# Patient Record
Sex: Female | Born: 1980 | Race: White | Hispanic: No | Marital: Single | Smoking: Current every day smoker
Health system: Southern US, Community
[De-identification: ages and names within clinical notes are randomized; demographics above are authoritative.]

---

## 2004-11-13 ENCOUNTER — Emergency Department: Payer: Self-pay | Admitting: Emergency Medicine

## 2004-11-18 ENCOUNTER — Emergency Department: Payer: Self-pay | Admitting: General Practice

## 2005-02-17 ENCOUNTER — Emergency Department: Payer: Self-pay | Admitting: Unknown Physician Specialty

## 2005-06-30 ENCOUNTER — Emergency Department: Payer: Self-pay | Admitting: Emergency Medicine

## 2005-07-10 ENCOUNTER — Emergency Department: Payer: Self-pay | Admitting: Emergency Medicine

## 2005-07-15 ENCOUNTER — Emergency Department: Payer: Self-pay | Admitting: Emergency Medicine

## 2005-07-16 ENCOUNTER — Ambulatory Visit: Payer: Self-pay | Admitting: Emergency Medicine

## 2005-09-10 ENCOUNTER — Observation Stay: Payer: Self-pay

## 2005-09-25 ENCOUNTER — Emergency Department: Payer: Self-pay | Admitting: Emergency Medicine

## 2006-01-06 ENCOUNTER — Observation Stay: Payer: Self-pay | Admitting: Obstetrics and Gynecology

## 2006-06-09 ENCOUNTER — Emergency Department: Payer: Self-pay | Admitting: Emergency Medicine

## 2006-06-29 ENCOUNTER — Ambulatory Visit: Payer: Self-pay | Admitting: Unknown Physician Specialty

## 2007-01-12 ENCOUNTER — Ambulatory Visit: Payer: Self-pay

## 2007-01-17 ENCOUNTER — Ambulatory Visit: Payer: Self-pay | Admitting: Internal Medicine

## 2007-01-18 ENCOUNTER — Emergency Department: Payer: Self-pay | Admitting: Emergency Medicine

## 2007-01-25 ENCOUNTER — Emergency Department: Payer: Self-pay | Admitting: Emergency Medicine

## 2007-03-08 ENCOUNTER — Observation Stay: Payer: Self-pay | Admitting: Internal Medicine

## 2007-03-08 ENCOUNTER — Other Ambulatory Visit: Payer: Self-pay

## 2007-05-03 ENCOUNTER — Emergency Department: Payer: Self-pay

## 2013-08-04 ENCOUNTER — Emergency Department (HOSPITAL_COMMUNITY)
Admission: EM | Admit: 2013-08-04 | Discharge: 2013-08-04 | Disposition: A | Discharge status: Home / Self Care | Payer: Self-pay | Attending: Emergency Medicine | Admitting: Emergency Medicine

## 2013-08-04 ENCOUNTER — Encounter (HOSPITAL_COMMUNITY): Payer: Self-pay | Admitting: Emergency Medicine

## 2013-08-04 DIAGNOSIS — H9209 Otalgia, unspecified ear: Secondary | ICD-10-CM | POA: Insufficient documentation

## 2013-08-04 DIAGNOSIS — K029 Dental caries, unspecified: Secondary | ICD-10-CM | POA: Insufficient documentation

## 2013-08-04 DIAGNOSIS — K047 Periapical abscess without sinus: Secondary | ICD-10-CM

## 2013-08-04 DIAGNOSIS — K044 Acute apical periodontitis of pulpal origin: Secondary | ICD-10-CM | POA: Insufficient documentation

## 2013-08-04 DIAGNOSIS — F17200 Nicotine dependence, unspecified, uncomplicated: Secondary | ICD-10-CM | POA: Insufficient documentation

## 2013-08-04 DIAGNOSIS — Z9889 Other specified postprocedural states: Secondary | ICD-10-CM | POA: Insufficient documentation

## 2013-08-04 DIAGNOSIS — R51 Headache: Secondary | ICD-10-CM | POA: Insufficient documentation

## 2013-08-04 MED ORDER — HYDROCODONE-ACETAMINOPHEN 5-325 MG PO TABS: 1.0000 | ORAL_TABLET | ORAL | Status: DC | PRN

## 2013-08-04 MED ORDER — PENICILLIN V POTASSIUM 500 MG PO TABS: 500.0000 mg | ORAL_TABLET | Freq: Three times a day (TID) | ORAL | Status: DC

## 2013-08-04 MED ORDER — PENICILLIN V POTASSIUM 250 MG PO TABS
500.0000 mg | ORAL_TABLET | Freq: Once | ORAL | Status: AC
  Administered 2013-08-04: 500 mg via ORAL
  Filled 2013-08-04: qty 2

## 2013-08-04 MED ORDER — HYDROCODONE-ACETAMINOPHEN 5-325 MG PO TABS
1.0000 | ORAL_TABLET | Freq: Once | ORAL | Status: AC
  Administered 2013-08-04: 1 via ORAL
  Filled 2013-08-04: qty 1

## 2013-08-04 NOTE — ED Provider Notes (Signed)
CSN: [id number]     Arrival date & time 08/04/13  1536 History  This chart was scribed for non-physician practitioner Dierdre Forth, PA, working with Dagmar Hait, MD by Ronal Fear, ED scribe. This patient was seen in room TR05C/TR05C and the patient's care was started at 4:40 PM.     Chief Complaint  Patient presents with  . Dental Pain   (Consider location/radiation/quality/duration/timing/severity/associated sxs/prior Treatment) The history is provided by the patient and medical records. No language interpreter was used.   HPI Comments: Betty Curry is a 33 y.o. female who presents to the Emergency Department complaining of sudden onset left sided lower dental pain onset 1x week ago. Pt states that after a lower incisor chipped she noticed a lump present in her left lower jaw 1 week ago.  She had surgery 1 months ago in [place] to remove 2 molars and states that they left remnants of teeth in her mouth. Pt states that the pain is a constant, dull ache at 9/10.  She has taken tramadol and ibuprofen with no relief.  She does not see a dentist regularly, she smokes 1/2 pack a day. She endorses associated subjective chills and unmeasured fever, but denies nausea, vomiting and facial swelling.       History reviewed. No pertinent past medical history. History reviewed. No pertinent past surgical history. History reviewed. No pertinent family history. History  Substance Use Topics  . Smoking status: Current Every Day Smoker    Types: Cigarettes  . Smokeless tobacco: Not on file  . Alcohol Use: No   OB History   Grav Para Term Preterm Abortions TAB SAB Ect Mult Living                 Review of Systems  Constitutional: Negative for fever, chills and appetite change.  HENT: Positive for dental problem and ear pain. Negative for drooling, facial swelling, nosebleeds, postnasal drip, rhinorrhea and trouble swallowing.   Eyes: Negative for pain and redness.  Respiratory:  Negative for cough and wheezing.   Cardiovascular: Negative for chest pain.  Gastrointestinal: Negative for nausea, vomiting and abdominal pain.  Musculoskeletal: Negative for neck pain and neck stiffness.  Skin: Negative for color change and rash.  Neurological: Positive for headaches (generalized). Negative for weakness and light-headedness.  All other systems reviewed and are negative.    Allergies  Review of patient's allergies indicates no known allergies.  Home Medications   Current Outpatient Rx  Name  Route  Sig  Dispense  Refill  . ibuprofen (ADVIL,MOTRIN) 600 MG tablet   Oral   Take 600 mg by mouth every 6 (six) hours as needed for moderate pain.         . traMADol (ULTRAM) 50 MG tablet   Oral   Take 50 mg by mouth every 6 (six) hours as needed for severe pain.         Marland Kitchen HYDROcodone-acetaminophen (NORCO/VICODIN) 5-325 MG per tablet   Oral   Take 1 tablet by mouth every 4 (four) hours as needed.   6 tablet   0   . penicillin v potassium (VEETID) 500 MG tablet   Oral   Take 1 tablet (500 mg total) by mouth 3 (three) times daily.   30 tablet   0    BP 105/66  Pulse 87  Temp(Src) 97.7 F (36.5 C)  Resp 18  SpO2 100% Physical Exam  Nursing note and vitals reviewed. Constitutional: She is oriented to person, place, and  time. She appears well-developed and well-nourished.  HENT:  Head: Normocephalic and atraumatic.  Right Ear: Tympanic membrane, external ear and ear canal normal.  Left Ear: Tympanic membrane, external ear and ear canal normal.  Nose: Nose normal. Right sinus exhibits no maxillary sinus tenderness and no frontal sinus tenderness. Left sinus exhibits no maxillary sinus tenderness and no frontal sinus tenderness.  Mouth/Throat: Uvula is midline, oropharynx is clear and moist and mucous membranes are normal. No oral lesions. Abnormal dentition. Dental caries present. No uvula swelling or lacerations. No oropharyngeal exudate, posterior  oropharyngeal edema, posterior oropharyngeal erythema or tonsillar abscesses.    No facial swelling noted No gross abscess No induration to the floor of the mouth  Eyes: Conjunctivae are normal. Pupils are equal, round, and reactive to light. Right eye exhibits no discharge. Left eye exhibits no discharge.  Neck: Normal range of motion. Neck supple.  Cardiovascular: Normal rate, regular rhythm, normal heart sounds and intact distal pulses.   No murmur heard. Pulmonary/Chest: Effort normal and breath sounds normal. No respiratory distress. She has no wheezes.  Abdominal: Soft. Bowel sounds are normal. She exhibits no distension. There is no tenderness.  Lymphadenopathy:    She has no cervical adenopathy.  Neurological: She is alert and oriented to person, place, and time.  Skin: Skin is warm and dry. No erythema.  Psychiatric: She has a normal mood and affect.    ED Course  Procedures (including critical care time)  DIAGNOSTIC STUDIES: Oxygen Saturation is 100% on RA, normal by my interpretation.    COORDINATION OF CARE: 4:48 PM- Pt advised of plan for treatment including antibiotics, a dental referral, and medication for pain and pt agrees.    Labs Review Labs Reviewed - No data to display Imaging Review No results found.  EKG Interpretation   None       MDM   1. Pain due to dental caries   2. Dental infection      Betty Curry presents with dental pain and caries.  Patient with toothache.  No gross abscess.  Exam unconcerning for Ludwig's angina or spread of infection.  Will treat with penicillin and pain medicine.  Urged patient to follow-up with dentist.  Pt given resources for dentistry.     It has been determined that no acute conditions requiring further emergency intervention are present at this time. The patient/guardian have been advised of the diagnosis and plan. We have discussed signs and symptoms that warrant return to the ED, such as changes or  worsening in symptoms.   Vital signs are stable at discharge.   BP 105/66  Pulse 87  Temp(Src) 97.7 F (36.5 C)  Resp 18  SpO2 100%  Patient/guardian has voiced understanding and agreed to follow-up with the PCP or specialist.    I personally performed the services described in this documentation, which was scribed in my presence. The recorded information has been reviewed and is accurate.   Dierdre ForthHannah Jeniel Slauson, PA-C 08/04/13 1700  Jashay Roddy, PA-C 08/04/13 1701

## 2013-08-04 NOTE — Discharge Instructions (Signed)
1. Medications: vicodin, penicillin, usual home medications °2. Treatment: rest, drink plenty of fluids, take medications as prescribed °3. Follow Up: Please followup with your primary doctor for discussion of your diagnoses and further evaluation after today's visit; if you do not have a primary care doctor use the resource guide provided to find one; f/u with dentistry as discussed ° ° °You have a dental injury. Use the resource guide listed below to help you find a dentist if you do not already have one to followup with. It is very important that you get evaluated by a dentist as soon as possible. Call tomorrow to schedule an appointment. Use your pain medication as prescribed and do not operate heavy machinery while on pain medication. Note that your pain medication contains acetaminophen (Tylenol) & its is not reccommended that you use additional acetaminophen (Tylenol) while taking this medication. Take your full course of antibiotics. Read the instructions below. ° °Eat a soft or liquid diet and rinse your mouth out after meals with warm water. You should see a dentist or return here at once if you have increased swelling, increased pain or uncontrolled bleeding from the site of your injury. ° ° °SEEK MEDICAL CARE IF:  °· You have increased pain not controlled with medicines.  °· You have swelling around your tooth, in your face or neck.  °· You have bleeding which starts, continues, or gets worse.  °· You have a fever >101 °· If you are unable to open your mouth ° ° °Emergency Department Resource Guide °1) Find a Doctor and Pay Out of Pocket °Although you won't have to find out who is covered by your insurance plan, it is a good idea to ask around and get recommendations. You will then need to call the office and see if the doctor you have chosen will accept you as a new patient and what types of options they offer for patients who are self-pay. Some doctors offer discounts or will set up payment plans for  their patients who do not have insurance, but you will need to ask so you aren't surprised when you get to your appointment. ° °2) Contact Your Local Health Department °Not all health departments have doctors that can see patients for sick visits, but many do, so it is worth a call to see if yours does. If you don't know where your local health department is, you can check in your phone book. The CDC also has a tool to help you locate your state's health department, and many state websites also have listings of all of their local health departments. ° °3) Find a Walk-in Clinic °If your illness is not likely to be very severe or complicated, you may want to try a walk in clinic. These are popping up all over the country in pharmacies, drugstores, and shopping centers. They're usually staffed by nurse practitioners or physician assistants that have been trained to treat common illnesses and complaints. They're usually fairly quick and inexpensive. However, if you have serious medical issues or chronic medical problems, these are probably not your best option. ° °No Primary Care Doctor: °- Call [facility] at  832-8000 - they can help you locate a primary care doctor that  accepts your insurance, provides certain services, etc. °- Physician Referral Service- 1-800-533-3463 ° °Chronic Pain Problems: °Organization         Address  Phone   Notes  °[facility]  (336) 297-2271 Patients need to be referred   by their primary care doctor.  ° °Medication Assistance: °Organization         Address  Phone   Notes  °[place] [facility] [address] °[place], [place] [address] (336) 641-8030 --Must be a resident of [place] °-- Must have NO insurance coverage whatsoever (no Medicaid/ Medicare, etc.) °-- The pt. MUST have a primary care doctor that directs their care regularly and follows them in the community °  °[facility]  (866) 331-1348   °[employer]  (888)  892-1162   ° °Agencies that provide inexpensive medical care: °Organization         Address  Phone   Notes  °[facility]  (336) 832-8035   °[facility]    (336) 832-7272   °[facility] [address] °[place], [place] [address] (336) 832-4777   °[facility] [address], °[place] (336) 271-4999   °[facility]    (336) 373-0678   °[facility]    (336) 272-1050   °[facility] ° [address], [place] Phone:  (336) 832-4444, Fax:  (336) 832-4440 Hours of Operation:  9 am - 6 pm, M-F.  Also accepts Medicaid/Medicare and self-pay.  °[facility] ° [address], [place] Phone: (336) 832-3150, Fax: (336) 832-3151. Hours of Operation:  8:30 am - 5:30 pm, M-F.  Also accepts Medicaid and self-pay.  °[facility] [place] [address], [place] Phone: (336) 878-6027   °[facility] [address], [place], [place] (336)723-1848, Ext. 123 Mondays & Thursdays: 7-9 AM.  First 15 patients are seen on a first come, first serve basis. °  ° °Medicaid-accepting [place] Providers: ° °Organization         Address  Phone   Notes  °[facility] [address], [place] (336) 641-2100 Also accepts self-pay patients.  °[facility] [address], [place] ° (336) 856-9996   °[facility] [address], [place] (336) 288-8857   °[facility] [address] [place] [address], [place] (336) 299-7000   °Veita Bland [address], [place]  ° (336) 373-1557 Only accepts [place] Access Medicaid patients after they have their name applied to their card.  ° °Self-Pay (no insurance) in [place]: ° °Organization         Address  Phone   Notes  °Sickle Cell Patients, [facility] [address], [place] (336)  832-1970   °[facility] [address], [place] (336) 832-4400   °[facility] ° [address], [place] (336) 992-4800   °[facility]/Dr. Osei-Bonsu ° [address] [place] [address], [place] or [address], [place] (336) 841-8500 Phone number for both [place] and [place] locations is the same.  °[facility] [address], [place] (336) 299-0000   °[facility] [address] [place] [address], [place] or [address] (336) 852-7530 °(336) 878-2260   °[facility] [address],  (336) 350-1642, phone; (336) 294-5005, fax Sees patients 1st and 3rd Saturday of every month.  Must not qualify for public or private insurance (i.e. Medicaid, Medicare, [place] Health Choice, Veterans' Benefits) • Household income should be no more   than 200% of the poverty level •The clinic cannot treat you if you are pregnant or think you are pregnant • Sexually transmitted diseases are not treated at the clinic.  ° ° °Dental Care: °Organization         Address  Phone  Notes  °[facility] [facility] [address], [place] (336) 641-6152 Accepts children up to age 21 who are enrolled in Medicaid or [place] Health Choice; pregnant women with a Medicaid card; and children who have applied for Medicaid or [place] Health Choice, but were declined, whose parents can pay a reduced fee at time of service.  °[facility] [place]  [address], [place] (336) 641-7733 Accepts children up to age 21 who are enrolled in Medicaid or [place] Health Choice; pregnant women with a Medicaid card; and children who have applied for Medicaid or [place] Health Choice, but were declined, whose parents can pay a reduced fee at time of service.  °[facility] ° [address], [place] (336) 641-4533 Patients are  seen by appointment only. Walk-ins are not accepted. [facility] will see patients 18 years of age and older. °Monday - Tuesday (8am-5pm) °Most Wednesdays (8:30-5pm) °$30 per visit, cash only  °[facility] ° [address], [place] (336) 641-4533 Patients are seen by appointment only. Walk-ins are not accepted. [facility] will see patients 18 years of age and older. °One Wednesday Evening (Monthly: Volunteer Based).  $30 per visit, cash only  °[facility]  (919) 537-3737 for adults; Children under age 4, call Graduate Pediatric Dentistry at (919) [phone]. Children aged 4-14, please call (919) [phone] to request a pediatric application. ° Dental services are provided in all areas of dental care including fillings, crowns and bridges, complete and partial dentures, implants, gum treatment, root canals, and extractions. Preventive care is also provided. Treatment is provided to both adults and children. °Patients are selected via a lottery and there is often a waiting list. °  °[facility] [address], °[place] ° (336) 763-8833 [email or web address] °  °[facility] [address], [place], [place] (336)723-1848, Ext. 123 Second and Fourth Thursday of each month, opens at 6:30 AM; Clinic ends at 9 AM.  Patients are seen on a first-come first-served basis, and a limited number are seen during each clinic.  ° °[facility] ° [address], [place], [place] (336) 723-7904   Eligibility Requirements °You must have lived in [place], [place], or [place] counties for at least the last three months. °  You cannot be eligible for state or federal sponsored healthcare insurance, including Veterans Administration, Medicaid, or Medicare. °  You generally cannot be eligible for healthcare insurance through your employer.  °  How to apply: °Eligibility screenings are held every Tuesday and Wednesday afternoon from 1:00 pm until 4:00  pm. You do not need an appointment for the interview!  °[facility] [address], [place],  336-631-2330   °[facility]  336-342-8273   °[facility]  336-703-3100   °[facility]  336-570-6415   ° °Behavioral Health Resources in the Community: °Intensive Outpatient Programs °Organization         Address  Phone  Notes  °[facility] [address], [place],  336-878-6098   °[facility] [address], [place], [place] 336-832-9800   °[facility] [address], [place], [place] ° 336-882-2125   °[facility] [address],  °[place], [place] 1-800-853-5163 or 336-641-4981   °Substance Abuse Resources °Organization         Address  Phone  Notes  °[facility]  336-882-2125   °[facility]  336-784-9470   °[facility]  336-285-9073   °[facility]  336-845-3988   °Residential & Outpatient Substance Abuse Program  1-800-659-3381   °Psychological Services °Organization         Address  Phone  Notes  °[facility]  336- 832-9600   °[facility]  336- 378-7881   °[facility] [address], [place] 1-800-853-5163 or 336-641-4981   ° °Mobile Crisis Teams °Organization         Address  Phone  Notes  °[facility], Mobile Crisis Care Unit  1-877-626-1772   °Assertive °[facility] ° [address] [place], [place] 336-834-9664   °Sharon DeEsch [address] °[place] 336-554-5454   ° °Self-Help/Support Groups °Organization         Address  Phone             Notes  °[facility] - variety of support groups  336- 373-1402 Call for more information  °Narcotics Anonymous (NA), [facility] [address], °[place]  2 meetings at this location  ° °Residential Treatment Programs °Organization          Address  Phone  Notes  °[facility] [address],    °[place]  1-866-801-8205   °[facility] ° [address], [place], [place] 704-293-8524   °[facility] [address], [place] 336-845-3988 Admissions: 8am-3pm M-F  °[facility] [address]    °[place], [place] 336-841-1104   °[facility] [address], [place], [place] 336-379-7146   °[facility] [address]  °[place], [place] 336-285-9073   °[facility] Intensive Outpatient [address], [place], [place] 336-852-3033   °[facility] [address]  °[place], [place] 1-877-615-2722 or 336-784-9470   °[facility] [address] [place], [place] 336-227-7417 Accepts Medicaid  °[facility] [address]  ° [place] 1-800-659-3381 Substance Abuse/Addiction Treatment  ° °[place] Behavioral Health Resources °Organization         Address  Phone  Notes  °[facility]  (888) 581-9988   °Julie Brannon, PhD [address] [place], [place]   (336) 349-5553 or (336) 951-0000   °[facility]   [address] °[place], [place] (336) 349-4454   °[facility] [address], [place], [place] (336) 342-8316 Insurance/Medicaid/sponsorship through [facility]  °[facility] [address]                                    [place], [place] (336) 342-8316 Therapy/tele-psych/case  °[facility] [address]  ° [place], [place] (336) 349-2233    °Dr. Arfeen  (336) 349-4544   °[facility] [place]  [employer] [place] [facility] 1) [address], [place] °2) [address], [place] °3)  [address], [place] (336) 349-3220 °(336) 342-7768 ° °(336) 342-8140   °[place] [facility] (336) 342-1394 or (336) 342-3537 (After Hours)    ° ° ° °

## 2013-08-04 NOTE — ED Notes (Addendum)
Pt reports she had multiple teeth pulled 2 months ago, shes been having pain since and yesterday one of the remaining teeth broke off. States she can see parts of her teeth remaining in her gums that were supposed to be pulled. Pt has been taking tramadol and ibuprofen with no relief

## 2013-08-04 NOTE — ED Provider Notes (Signed)
Medical screening examination/treatment/procedure(s) were performed by non-physician practitioner and as supervising physician I was immediately available for consultation/collaboration.  EKG Interpretation   None         Dagmar HaitWilliam Malavika Lira, MD 08/04/13 2317

## 2013-08-04 NOTE — Progress Notes (Signed)
[facility] received incoming phone call from patient regarding medication assistance. Pt [facility] to [facility] ED today with dental pain. Pt reports that she cannot afford her antibiotic Pen VK  And pain meds that was prescribed. She states, She only has $10. Discussed the [employer] Free antibiotics Program  with [facility] card. Explained the guidelines. Called HT confirmed Pen VK is on the free list.   [facility]. Pt voiced understanding and was appreciative. No further ED  CM  Need identified.

## 2013-08-25 ENCOUNTER — Emergency Department: Payer: Self-pay | Admitting: Internal Medicine

## 2013-08-25 LAB — CBC
HCT: 35.1 % (ref 35.0–47.0)
HGB: 11.6 g/dL — ABNORMAL LOW (ref 12.0–16.0)
MCH: 27.4 pg (ref 26.0–34.0)
MCHC: 33.2 g/dL (ref 32.0–36.0)
MCV: 82 fL (ref 80–100)
Platelet: 252 10*3/uL (ref 150–440)
RBC: 4.26 10*6/uL (ref 3.80–5.20)
RDW: 17.4 % — AB (ref 11.5–14.5)
WBC: 12.8 10*3/uL — ABNORMAL HIGH (ref 3.6–11.0)

## 2013-08-25 LAB — BASIC METABOLIC PANEL
ANION GAP: 7 (ref 7–16)
BUN: 8 mg/dL (ref 7–18)
CALCIUM: 9.3 mg/dL (ref 8.5–10.1)
CO2: 23 mmol/L (ref 21–32)
Chloride: 106 mmol/L (ref 98–107)
Creatinine: 0.5 mg/dL — ABNORMAL LOW (ref 0.60–1.30)
EGFR (African American): >60
EGFR (Non-African Amer.): >60
Glucose: 91 mg/dL (ref 65–99)
Osmolality: 270 (ref 275–301)
POTASSIUM: 4.7 mmol/L (ref 3.5–5.1)
SODIUM: 136 mmol/L (ref 136–145)

## 2013-08-25 LAB — URINALYSIS, COMPLETE
Bilirubin,UR: NEGATIVE
Glucose,UR: NEGATIVE mg/dL (ref 0–75)
KETONE: NEGATIVE
Nitrite: POSITIVE
Ph: 5 (ref 4.5–8.0)
Protein: 100
SPECIFIC GRAVITY: 1.01 (ref 1.003–1.030)
Squamous Epithelial: 2
WBC UR: 789 /HPF (ref 0–5)

## 2013-09-11 ENCOUNTER — Emergency Department: Payer: Self-pay | Admitting: Emergency Medicine

## 2013-09-11 LAB — CBC WITH DIFFERENTIAL/PLATELET
BASOS ABS: 0.1 10*3/uL (ref 0.0–0.1)
Basophil %: 1.3 %
Eosinophil #: 0.3 10*3/uL (ref 0.0–0.7)
Eosinophil %: 3.8 %
HCT: 36.5 % (ref 35.0–47.0)
HGB: 11.8 g/dL — ABNORMAL LOW (ref 12.0–16.0)
Lymphocyte #: 2.6 10*3/uL (ref 1.0–3.6)
Lymphocyte %: 35.4 %
MCH: 26.9 pg (ref 26.0–34.0)
MCHC: 32.2 g/dL (ref 32.0–36.0)
MCV: 84 fL (ref 80–100)
MONO ABS: 0.6 x10 3/mm (ref 0.2–0.9)
Monocyte %: 8.6 %
NEUTROS ABS: 3.7 10*3/uL (ref 1.4–6.5)
NEUTROS PCT: 50.9 %
Platelet: 354 10*3/uL (ref 150–440)
RBC: 4.37 10*6/uL (ref 3.80–5.20)
RDW: 16.9 % — AB (ref 11.5–14.5)
WBC: 7.2 10*3/uL (ref 3.6–11.0)

## 2013-09-11 LAB — URINALYSIS, COMPLETE
BILIRUBIN, UR: NEGATIVE
GLUCOSE, UR: NEGATIVE mg/dL (ref 0–75)
KETONE: NEGATIVE
LEUKOCYTE ESTERASE: NEGATIVE
Nitrite: NEGATIVE
PH: 6 (ref 4.5–8.0)
PROTEIN: NEGATIVE
RBC,UR: 23 /HPF (ref 0–5)
Specific Gravity: 1.004 (ref 1.003–1.030)
Squamous Epithelial: 4

## 2013-09-11 LAB — BASIC METABOLIC PANEL
Anion Gap: 3 — ABNORMAL LOW (ref 7–16)
BUN: 11 mg/dL (ref 7–18)
CHLORIDE: 104 mmol/L (ref 98–107)
CO2: 29 mmol/L (ref 21–32)
Calcium, Total: 8.9 mg/dL (ref 8.5–10.1)
Creatinine: 0.69 mg/dL (ref 0.60–1.30)
EGFR (Non-African Amer.): >60
GLUCOSE: 101 mg/dL — AB (ref 65–99)
OSMOLALITY: 271 (ref 275–301)
Potassium: 3.5 mmol/L (ref 3.5–5.1)
SODIUM: 136 mmol/L (ref 136–145)

## 2013-10-23 ENCOUNTER — Emergency Department (HOSPITAL_COMMUNITY)
Admission: EM | Admit: 2013-10-23 | Discharge: 2013-10-23 | Disposition: A | Discharge status: Home / Self Care | Payer: Self-pay | Attending: Emergency Medicine | Admitting: Emergency Medicine

## 2013-10-23 ENCOUNTER — Encounter (HOSPITAL_COMMUNITY): Payer: Self-pay | Admitting: Emergency Medicine

## 2013-10-23 DIAGNOSIS — R599 Enlarged lymph nodes, unspecified: Secondary | ICD-10-CM | POA: Insufficient documentation

## 2013-10-23 DIAGNOSIS — F17200 Nicotine dependence, unspecified, uncomplicated: Secondary | ICD-10-CM | POA: Insufficient documentation

## 2013-10-23 DIAGNOSIS — K047 Periapical abscess without sinus: Secondary | ICD-10-CM | POA: Insufficient documentation

## 2013-10-23 MED ORDER — PENICILLIN V POTASSIUM 250 MG PO TABS
500.0000 mg | ORAL_TABLET | Freq: Once | ORAL | Status: AC
  Administered 2013-10-23: 500 mg via ORAL
  Filled 2013-10-23: qty 2

## 2013-10-23 MED ORDER — OXYCODONE-ACETAMINOPHEN 5-325 MG PO TABS: 1.0000 | ORAL_TABLET | ORAL | Status: DC | PRN

## 2013-10-23 MED ORDER — PENICILLIN V POTASSIUM 500 MG PO TABS: 500.0000 mg | ORAL_TABLET | Freq: Three times a day (TID) | ORAL | Status: DC

## 2013-10-23 MED ORDER — OXYCODONE-ACETAMINOPHEN 5-325 MG PO TABS
1.0000 | ORAL_TABLET | Freq: Once | ORAL | Status: AC
  Administered 2013-10-23: 1 via ORAL
  Filled 2013-10-23: qty 1

## 2013-10-23 NOTE — ED Notes (Signed)
Patient states that she has been taking 800mg  Motrin and that is not helping

## 2013-10-23 NOTE — ED Provider Notes (Signed)
CSN: [id number]     Arrival date & time 10/23/13  2135 History  This chart was scribed for non-physician practitioner, Elpidio Anis, PA-C working with Merrie Roof, MD by Greggory Stallion, ED scribe. This patient was seen in room TR08C/TR08C and the patient's care was started at 10:54 PM.   Chief Complaint  Patient presents with  . Dental Pain   The history is provided by the patient. No language interpreter was used.   HPI Comments: Betty Curry is a 33 y.o. female who presents to the Emergency Department complaining of gradual onset, constant dental pain that started about one year ago. Pt states she has left sided facial swelling and fever that started earlier today. She has been seen for the same a few months ago and was referred to a dentist. Pt states she can not get seen for another two weeks and is going to apply for Medicaid. She has taken 800 mg ibuprofen with no relief.   History reviewed. No pertinent past medical history. History reviewed. No pertinent past surgical history. No family history on file. History  Substance Use Topics  . Smoking status: Current Every Day Smoker    Types: Cigarettes  . Smokeless tobacco: Not on file  . Alcohol Use: No   OB History   Grav Para Term Preterm Abortions TAB SAB Ect Mult Living                 Review of Systems  Constitutional: Positive for fever.  HENT: Positive for dental problem and facial swelling.   All other systems reviewed and are negative.   Allergies  Review of patient's allergies indicates no known allergies.  Home Medications   Current Outpatient Rx  Name  Route  Sig  Dispense  Refill  . HYDROcodone-acetaminophen (NORCO/VICODIN) 5-325 MG per tablet   Oral   Take 1 tablet by mouth every 4 (four) hours as needed.   6 tablet   0   . ibuprofen (ADVIL,MOTRIN) 600 MG tablet   Oral   Take 600 mg by mouth every 6 (six) hours as needed for moderate pain.         Marland Kitchen penicillin v potassium (VEETID) 500 MG  tablet   Oral   Take 1 tablet (500 mg total) by mouth 3 (three) times daily.   30 tablet   0   . traMADol (ULTRAM) 50 MG tablet   Oral   Take 50 mg by mouth every 6 (six) hours as needed for severe pain.          BP 108/62  Pulse 73  Temp(Src) 97.8 F (36.6 C) (Oral)  Resp 18  Ht 5\' 7"  (1.702 m)  Wt 130 lb (58.968 kg)  BMI 20.36 kg/m2  SpO2 100%  Physical Exam  Nursing note and vitals reviewed. Constitutional: She is oriented to person, place, and time. She appears well-developed and well-nourished. No distress.  HENT:  Head: Normocephalic and atraumatic.  Apical abscess at tooth #21.   Eyes: EOM are normal.  Neck: Neck supple. No tracheal deviation present.  Cardiovascular: Normal rate.   Pulmonary/Chest: Effort normal. No respiratory distress.  Musculoskeletal: Normal range of motion.  Lymphadenopathy:  Left submental and anterior cervical adenopathy.   Neurological: She is alert and oriented to person, place, and time.  Skin: Skin is warm and dry.  Psychiatric: She has a normal mood and affect. Her behavior is normal.    ED Course  Procedures (including critical care time)  DIAGNOSTIC  STUDIES: Oxygen Saturation is 100% on RA, normal by my interpretation.    COORDINATION OF CARE: 10:56 PM-Discussed treatment plan which includes an antibiotic and pain medication with pt at bedside and pt agreed to plan.   Labs Review Labs Reviewed - No data to display Imaging Review No results found.   EKG Interpretation None      MDM   Final diagnoses:  None    1. Dental abscess  Uncomplicated dental abscess with mild facial swelling. Afebrile. Abx, pain management and dental referral.   I personally performed the services described in this documentation, which was scribed in my presence. The recorded information has been reviewed and is accurate.  Arnoldo HookerShari A Kyomi Hector, PA-C 10/23/13 2305

## 2013-10-23 NOTE — ED Notes (Signed)
Patient states she has several broken teeth on the bottom left side of her mouth.  States she has an appointment in 2 weeks.  Left side of face swollen.

## 2013-10-23 NOTE — ED Notes (Signed)
Discharge inst reviewed  Voiced understanding

## 2013-10-23 NOTE — Discharge Instructions (Signed)
Abscessed Tooth  An abscessed tooth is an infection around your tooth. It may be caused by holes or damage to the tooth (cavity) or a dental disease. An abscessed tooth causes mild to very bad pain in and around the tooth. See your dentist right away if you have tooth or gum pain.  HOME CARE   Take your medicine as told. Finish it even if you start to feel better.   Do not drive after taking pain medicine.   Rinse your mouth (gargle) often with salt water ( teaspoon salt in 8 ounces of warm water).   Do not apply heat to the outside of your face.  GET HELP RIGHT AWAY IF:    You have a temperature by mouth above 102 F (38.9 C), not controlled by medicine.   You have chills and a very bad headache.   You have problems breathing or swallowing.   Your mouth will not open.   You develop puffiness (swelling) on the neck or around the eye.   Your pain is not helped by medicine.   Your pain is getting worse instead of better.  MAKE SURE YOU:    Understand these instructions.   Will watch your condition.   Will get help right away if you are not doing well or get worse.  Document Released: 12/24/2007 Document Revised: 09/29/2011 Document Reviewed: 10/15/2010  ExitCare Patient Information 2014 [employer].

## 2013-10-23 NOTE — ED Notes (Signed)
The pt has had dental problems for one month.  She reports that she has knots on the lt side of her face

## 2013-10-25 NOTE — ED Provider Notes (Signed)
Medical screening examination/treatment/procedure(s) were performed by non-physician practitioner and as supervising physician I was immediately available for consultation/collaboration.   Candyce ChurnJohn David Ben Habermann III, MD 10/25/13 [id number]

## 2013-10-30 ENCOUNTER — Emergency Department (HOSPITAL_COMMUNITY)
Admission: EM | Admit: 2013-10-30 | Discharge: 2013-10-30 | Disposition: A | Discharge status: Home / Self Care | Payer: Self-pay | Attending: Emergency Medicine | Admitting: Emergency Medicine

## 2013-10-30 ENCOUNTER — Encounter (HOSPITAL_COMMUNITY): Payer: Self-pay | Admitting: Emergency Medicine

## 2013-10-30 DIAGNOSIS — K047 Periapical abscess without sinus: Secondary | ICD-10-CM | POA: Insufficient documentation

## 2013-10-30 DIAGNOSIS — K029 Dental caries, unspecified: Secondary | ICD-10-CM | POA: Insufficient documentation

## 2013-10-30 DIAGNOSIS — F17200 Nicotine dependence, unspecified, uncomplicated: Secondary | ICD-10-CM | POA: Insufficient documentation

## 2013-10-30 DIAGNOSIS — Z792 Long term (current) use of antibiotics: Secondary | ICD-10-CM | POA: Insufficient documentation

## 2013-10-30 MED ORDER — OXYCODONE-ACETAMINOPHEN 5-325 MG PO TABS: 1.0000 | ORAL_TABLET | ORAL | Status: DC | PRN

## 2013-10-30 MED ORDER — KETOROLAC TROMETHAMINE 30 MG/ML IJ SOLN
30.0000 mg | Freq: Once | INTRAMUSCULAR | Status: AC
  Administered 2013-10-30: 30 mg via INTRAMUSCULAR
  Filled 2013-10-30: qty 1

## 2013-10-30 NOTE — ED Provider Notes (Signed)
Medical screening examination/treatment/procedure(s) were performed by non-physician practitioner and as supervising physician I was immediately available for consultation/collaboration.   EKG Interpretation None        Brandt LoosenJulie Manly, MD 10/30/13 [phone]

## 2013-10-30 NOTE — Discharge Instructions (Signed)
Dental Caries   Dental caries (also called tooth decay) is the most common oral disease. It can occur at any age, but is more common in children and young adults.   HOW DENTAL CARIES DEVELOPS   The process of decay begins when bacteria and foods (particularly sugars and starches) combine in your mouth to produce plaque. Plaque is a substance that sticks to the hard, outer surface of a tooth (enamel). The bacteria in plaque produce acids that attack enamel. These acids may also attack the root surface of a tooth (cementum) if it is exposed. Repeated attacks dissolve these surfaces and create holes in the tooth (cavities). If left untreated, the acids destroy the other layers of the tooth.   RISK FACTORS  · Frequent sipping of sugary beverages.    · Frequent snacking on sugary and starchy foods, especially those that easily get stuck in the teeth.    · Poor oral hygiene.    · Dry mouth.    · Substance abuse such as methamphetamine abuse.    · Broken or poor-fitting dental restorations.    · Eating disorders.    · Gastroesophageal reflux disease (GERD).    · Certain radiation treatments to the head and neck.  SYMPTOMS  In the early stages of dental caries, symptoms are seldom present. Sometimes white, chalky areas may be seen on the enamel or other tooth layers. In later stages, symptoms may include:  · Pits and holes on the enamel.  · Toothache after sweet, hot, or cold foods or drinks are consumed.  · Pain around the tooth.  · Swelling around the tooth.  DIAGNOSIS   Most of the time, dental caries is detected during a regular dental checkup. A diagnosis is made after a thorough medical and dental history is taken and the surfaces of your teeth are checked for signs of dental caries. Sometimes special instruments, such as lasers, are used to check for dental caries. Dental X-ray exams may be taken so that areas not visible to the eye (such as between the contact areas of the teeth) can be checked for cavities.    TREATMENT   If dental caries is in its early stages, it may be reversed with a fluoride treatment or an application of a remineralizing agent at the dental office. Thorough brushing and flossing at home is needed to aid these treatments. If it is in its later stages, treatment depends on the location and extent of tooth destruction:   · If a small area of the tooth has been destroyed, the destroyed area will be removed and cavities will be filled with a material such as gold, silver amalgam, or composite resin.    · If a large area of the tooth has been destroyed, the destroyed area will be removed and a cap (crown) will be fitted over the remaining tooth structure.    · If the center part of the tooth (pulp) is affected, a procedure called a root canal will be needed before a filling or crown can be placed.    · If most of the tooth has been destroyed, the tooth may need to be pulled (extracted).  HOME CARE INSTRUCTIONS  You can prevent, stop, or reverse dental caries at home by practicing good oral hygiene. Good oral hygiene includes:  · Thoroughly cleaning your teeth at least twice a day with a toothbrush and dental floss.    · Using a fluoride toothpaste. A fluoride mouth rinse may also be used if recommended by your dentist or health care provider.    ·   Restricting the amount of sugary and starchy foods and sugary liquids you consume.    · Avoiding frequent snacking on these foods and sipping of these liquids.    · Keeping regular visits with a dentist for checkups and cleanings.  PREVENTION   · Practice good oral hygiene.  · Consider a dental sealant. A dental sealant is a coating material that is applied by your dentist to the pits and grooves of teeth. The sealant prevents food from being trapped in them. It may protect the teeth for several years.  · Ask about fluoride supplements if you live in a community without fluorinated water or with water that has a low fluoride content. Use fluoride supplements  as directed by your dentist or health care provider.  · Allow fluoride varnish applications to teeth if directed by your dentist or health care provider.  Document Released: 03/29/2002 Document Revised: 03/09/2013 Document Reviewed: 07/09/2012  ExitCare® Patient Information ©2014 [employer].

## 2013-10-30 NOTE — ED Provider Notes (Signed)
CSN: [id number]     Arrival date & time 10/30/13  4098 History   First MD Initiated Contact with Patient 10/30/13 0617     Chief Complaint  Patient presents with  . Dental Pain     (Consider location/radiation/quality/duration/timing/severity/associated sxs/prior Treatment) HPI  33 year old female presents complaining of dental pain and dental abscess. This is patient's second visit for the same complaint. Last visit was April 5.  Patient states for the past week she has had persistent pain to her left lower tooth. Pain is sharp, throbbing, worsening with chewing, and also associated with facial swelling. States she feel a knot on her jaw line near the affected tooth.  She was given percocet and penicillin from the last visit which has helped but she ran out of pain medication.  sts she is still taking her abx.  Denies fever, chills, sore throat, rash.  She has contacted dentist Dr. Georgie Chard but her appointment is April 21st, and she cannot wait that long.  Pain is keeping her up at night.  She has also tried orajel with minimal relief.    History reviewed. No pertinent past medical history. History reviewed. No pertinent past surgical history. History reviewed. No pertinent family history. History  Substance Use Topics  . Smoking status: Current Every Day Smoker    Types: Cigarettes  . Smokeless tobacco: Not on file  . Alcohol Use: No   OB History   Grav Para Term Preterm Abortions TAB SAB Ect Mult Living                 Review of Systems  Constitutional: Negative for fever.  HENT: Positive for dental problem.   Skin: Negative for rash.  Neurological: Negative for numbness.      Allergies  Review of patient's allergies indicates no known allergies.  Home Medications   Current Outpatient Rx  Name  Route  Sig  Dispense  Refill  . ibuprofen (ADVIL,MOTRIN) 600 MG tablet   Oral   Take 600 mg by mouth every 6 (six) hours as needed for moderate pain.         Marland Kitchen  oxyCODONE-acetaminophen (PERCOCET/ROXICET) 5-325 MG per tablet   Oral   Take 1-2 tablets by mouth every 4 (four) hours as needed for severe pain.   15 tablet   0   . penicillin v potassium (VEETID) 500 MG tablet   Oral   Take 1 tablet (500 mg total) by mouth 3 (three) times daily.   30 tablet   0    BP 110/72  Pulse 92  Temp(Src) 97.6 F (36.4 C) (Oral)  Resp 20  SpO2 99%  LMP 10/30/2013 Physical Exam  Nursing note and vitals reviewed. Constitutional: She appears well-developed and well-nourished. She appears distressed (uncomfortable appearing, in a fetal position).  HENT:  Head: Atraumatic.  Patients with multiple dental caries throughout. Tenderness to teeth #20 with associat caries. A small apical abscess were noted not amenable to drainage. No significant facial involvement. No trismus, no throat involvement.  Eyes: Conjunctivae are normal.  Neck: Neck supple. No tracheal deviation present. No thyromegaly present.  Lymphadenopathy:    She has no cervical adenopathy.  Neurological: She is alert.  Skin: No rash noted.  Psychiatric: She has a normal mood and affect.    ED Course  Procedures (including critical care time)  6:49 AM Pt with dental caries, dental pain, and an apical abscess without obvious facial involvement.  No systemic involvement.  Given that pt does have  a small apical abscess which is not amenable for drainage, i will provide additional pain medications and encourage pt to contact her dentist again for a close follow up.  Otherwise pt is stable for discharge.    Labs Review Labs Reviewed - No data to display Imaging Review No results found.   EKG Interpretation None      MDM   Final diagnoses:  Pain due to dental caries    BP 97/53  Pulse 93  Temp(Src) 97.6 F (36.4 C) (Oral)  Resp 20  SpO2 100%  LMP 10/30/2013     Fayrene HelperBowie Nelva Hauk, PA-C 10/30/13 0720

## 2013-10-30 NOTE — ED Notes (Signed)
Pt c/o pain from abcess pain radiates from mouth to face to ear on left side. Was Seen here before for Dental abscess.

## 2013-11-30 ENCOUNTER — Emergency Department (HOSPITAL_COMMUNITY)
Admission: EM | Admit: 2013-11-30 | Discharge: 2013-11-30 | Disposition: A | Discharge status: Home / Self Care | Payer: Self-pay | Attending: Emergency Medicine | Admitting: Emergency Medicine

## 2013-11-30 ENCOUNTER — Encounter (HOSPITAL_COMMUNITY): Payer: Self-pay | Admitting: Emergency Medicine

## 2013-11-30 DIAGNOSIS — Z791 Long term (current) use of non-steroidal anti-inflammatories (NSAID): Secondary | ICD-10-CM | POA: Insufficient documentation

## 2013-11-30 DIAGNOSIS — K0889 Other specified disorders of teeth and supporting structures: Secondary | ICD-10-CM

## 2013-11-30 DIAGNOSIS — F17200 Nicotine dependence, unspecified, uncomplicated: Secondary | ICD-10-CM | POA: Insufficient documentation

## 2013-11-30 DIAGNOSIS — K029 Dental caries, unspecified: Secondary | ICD-10-CM | POA: Insufficient documentation

## 2013-11-30 DIAGNOSIS — K0381 Cracked tooth: Secondary | ICD-10-CM | POA: Insufficient documentation

## 2013-11-30 MED ORDER — OXYCODONE HCL 5 MG PO CAPS: 5.0000 mg | ORAL_CAPSULE | ORAL | Status: DC | PRN

## 2013-11-30 MED ORDER — AMOXICILLIN 500 MG PO CAPS: 500.0000 mg | ORAL_CAPSULE | Freq: Three times a day (TID) | ORAL | Status: DC

## 2013-11-30 MED ORDER — OXYCODONE-ACETAMINOPHEN 5-325 MG PO TABS
1.0000 | ORAL_TABLET | Freq: Once | ORAL | Status: AC
  Administered 2013-11-30: 1 via ORAL
  Filled 2013-11-30: qty 1

## 2013-11-30 NOTE — ED Notes (Signed)
Pt. Reports left sided lower dental pain. Seen here previously for the same. States she has been trying to get in to see dentist and is having problems with medicaid.

## 2013-11-30 NOTE — Discharge Instructions (Signed)
Dental Pain A tooth ache may be caused by cavities (tooth decay). Cavities expose the nerve of the tooth to air and hot or cold temperatures. It may come from an infection or abscess (also called a boil or furuncle) around your tooth. It is also often caused by dental caries (tooth decay). This causes the pain you are having. DIAGNOSIS  Your caregiver can diagnose this problem by exam. TREATMENT   If caused by an infection, it may be treated with medications which kill germs (antibiotics) and pain medications as prescribed by your caregiver. Take medications as directed.  Only take over-the-counter or prescription medicines for pain, discomfort, or fever as directed by your caregiver.  Whether the tooth ache today is caused by infection or dental disease, you should see your dentist as soon as possible for further care. SEEK MEDICAL CARE IF: The exam and treatment you received today has been provided on an emergency basis only. This is not a substitute for complete medical or dental care. If your problem worsens or new problems (symptoms) appear, and you are unable to meet with your dentist, call or return to this location. SEEK IMMEDIATE MEDICAL CARE IF:   You have a fever.  You develop redness and swelling of your face, jaw, or neck.  You are unable to open your mouth.  You have severe pain uncontrolled by pain medicine. MAKE SURE YOU:   Understand these instructions.  Will watch your condition.  Will get help right away if you are not doing well or get worse. Document Released: 07/07/2005 Document Revised: 09/29/2011 Document Reviewed: 02/23/2008 [facility] CenterExitCare Patient Information 2014 [place], [place].  Dental Pain A tooth ache may be caused by cavities (tooth decay). Cavities expose the nerve of the tooth to air and hot or cold temperatures. It may come from an infection or abscess (also called a boil or furuncle) around your tooth. It is also often caused by dental caries (tooth  decay). This causes the pain you are having. DIAGNOSIS  Your caregiver can diagnose this problem by exam. TREATMENT   If caused by an infection, it may be treated with medications which kill germs (antibiotics) and pain medications as prescribed by your caregiver. Take medications as directed.  Only take over-the-counter or prescription medicines for pain, discomfort, or fever as directed by your caregiver.  Whether the tooth ache today is caused by infection or dental disease, you should see your dentist as soon as possible for further care. SEEK MEDICAL CARE IF: The exam and treatment you received today has been provided on an emergency basis only. This is not a substitute for complete medical or dental care. If your problem worsens or new problems (symptoms) appear, and you are unable to meet with your dentist, call or return to this location. SEEK IMMEDIATE MEDICAL CARE IF:   You have a fever.  You develop redness and swelling of your face, jaw, or neck.  You are unable to open your mouth.  You have severe pain uncontrolled by pain medicine. MAKE SURE YOU:   Understand these instructions.  Will watch your condition.  Will get help right away if you are not doing well or get worse. Document Released: 07/07/2005 Document Revised: 09/29/2011 Document Reviewed: 02/23/2008 [facility] LLCExitCare Patient Information 2014 [place]ExitCare, [place]LLC.  Dental Care and Dentist Visits Dental care supports good overall health. Regular dental visits can also help you avoid dental pain, bleeding, infection, and other more serious health problems in the future. It is important to keep the mouth  healthy because diseases in the teeth, gums, and other oral tissues can spread to other areas of the body. Some problems, such as diabetes, heart disease, and pre-term labor have been associated with poor oral health.  See your dentist every 6 months. If you experience emergency problems such as a toothache or broken tooth, go  to the dentist right away. If you see your dentist regularly, you may catch problems early. It is easier to be treated for problems in the early stages.  WHAT TO EXPECT AT A DENTIST VISIT  Your dentist will look for many common oral health problems and recommend proper treatment. At your regular dental visit, you can expect:  Gentle cleaning of the teeth and gums. This includes scraping and polishing. This helps to remove the sticky substance around the teeth and gums (plaque). Plaque forms in the mouth shortly after eating. Over time, plaque hardens on the teeth as tartar. If tartar is not removed regularly, it can cause problems. Cleaning also helps remove stains.  Periodic X-rays. These pictures of the teeth and supporting bone will help your dentist assess the health of your teeth.  Periodic fluoride treatments. Fluoride is a natural mineral shown to help strengthen teeth. Fluoride treatmentinvolves applying a fluoride gel or varnish to the teeth. It is most commonly done in children.  Examination of the mouth, tongue, jaws, teeth, and gums to look for any oral health problems, such as:  Cavities (dental caries). This is decay on the tooth caused by plaque, sugar, and acid in the mouth. It is best to catch a cavity when it is small.  Inflammation of the gums caused by plaque buildup (gingivitis).  Problems with the mouth or malformed or misaligned teeth.  Oral cancer or other diseases of the soft tissues or jaws. KEEP YOUR TEETH AND GUMS HEALTHY For healthy teeth and gums, follow these general guidelines as well as your dentist's specific advice:  Have your teeth professionally cleaned at the dentist every 6 months.  Brush twice daily with a fluoride toothpaste.  Floss your teeth daily.  Ask your dentist if you need fluoride supplements, treatments, or fluoride toothpaste.  Eat a healthy diet. Reduce foods and drinks with added sugar.  Avoid smoking. TREATMENT FOR ORAL HEALTH  PROBLEMS If you have oral health problems, treatment varies depending on the conditions present in your teeth and gums.  Your caregiver will most likely recommend good oral hygiene at each visit.  For cavities, gingivitis, or other oral health disease, your caregiver will perform a procedure to treat the problem. This is typically done at a separate appointment. Sometimes your caregiver will refer you to another dental specialist for specific tooth problems or for surgery. SEEK IMMEDIATE DENTAL CARE IF:  You have pain, bleeding, or soreness in the gum, tooth, jaw, or mouth area.  A permanent tooth becomes loose or separated from the gum socket.  You experience a blow or injury to the mouth or jaw area. Document Released: 03/19/2011 Document Revised: 09/29/2011 Document Reviewed: 03/19/2011 [facility] CenterExitCare Patient Information 2014 [place]ExitCare, MarylandLLC.

## 2013-11-30 NOTE — ED Notes (Signed)
Refused to have vitals reassessed and refused to stay. States her babysitter has to leave.

## 2013-11-30 NOTE — ED Notes (Signed)
Pt. States I feel like I'm going to pass out or hyperventilate because of the pain. Pt. Chair laid back, offered patient ice pack but refused. Pt. States "the pain medicine isn't working". Told she needs to give medicine time to work. Delay explained to patient.

## 2013-11-30 NOTE — ED Provider Notes (Signed)
CSN: [id number]     Arrival date & time 11/30/13  1806 History   None    Chief Complaint  Patient presents with  . Dental Pain   Patient is a 33 y.o. female presenting with tooth pain.  Dental Pain Associated symptoms: no fever    This chart was scribed for non-physician practitioner working with No att. providers found, by Andrew Auaven Small, ED Scribe. This patient was seen in room TR10C/TR10C and the patient's care was started at 11:02 PM.  Betty Curry is a 33 y.o. female who presents to the emergency department with a dental complaint. Symptoms began 3 days ago.  Pain rated at a 10/10, characterized as throbbing in nature and located everywhere within her mouth. Patient denies fever, night sweats, chills, difficulty swallowing or opening mouth, SOB, nuchal rigidity or decreased ROM of neck.  Patient does not have a PCP and requests a resource guide at discharge. Pt reports that the pain radiates down her neck. Pt is allergic to Toradol.  Pt reports that she has an appointment in 2 weeks for a dentist.     History reviewed. No pertinent past medical history. History reviewed. No pertinent past surgical history. No family history on file. History  Substance Use Topics  . Smoking status: Current Every Day Smoker    Types: Cigarettes  . Smokeless tobacco: Not on file  . Alcohol Use: No   OB History   Grav Para Term Preterm Abortions TAB SAB Ect Mult Living                 Review of Systems  Constitutional: Negative for fever, chills and fatigue.  HENT: Positive for dental problem.   Respiratory: Negative for shortness of breath.   All other systems reviewed and are negative.   Allergies  Toradol  Home Medications   Prior to Admission medications   Medication Sig Start Date End Date Taking? Authorizing Provider  ibuprofen (ADVIL,MOTRIN) 600 MG tablet Take 600 mg by mouth every 6 (six) hours as needed for moderate pain.    Historical Provider, MD  oxyCODONE-acetaminophen  (PERCOCET/ROXICET) 5-325 MG per tablet Take 1-2 tablets by mouth every 4 (four) hours as needed for severe pain. 10/30/13   Fayrene HelperBowie Tran, PA-C  penicillin v potassium (VEETID) 500 MG tablet Take 1 tablet (500 mg total) by mouth 3 (three) times daily. 10/23/13   Shari A Upstill, PA-C   BP 94/61  Pulse 93  Temp(Src) 98.3 F (36.8 C) (Oral)  Resp 18  Ht 5\' 7"  (1.702 m)  Wt 150 lb (68.04 kg)  BMI 23.49 kg/m2  SpO2 99%  LMP 11/25/2013 Physical Exam  Nursing note and vitals reviewed. Constitutional: She is oriented to person, place, and time. She appears well-developed and well-nourished. No distress.  HENT:  Head: Normocephalic and atraumatic.  Mouth/Throat: Uvula is midline, oropharynx is clear and moist and mucous membranes are normal. No trismus in the jaw. Normal dentition. Dental caries (Pts tooth shows no obvious abscess but moderate to severe tenderness to palpation of marked tooth) present. No uvula swelling.  Pt has diffuse dental decay that is severe with most teeth broken and rotting. Pain to all teeth.  Eyes: EOM are normal. Pupils are equal, round, and reactive to light.  Neck: Trachea normal, normal range of motion and full passive range of motion without pain. Neck supple.  Cardiovascular: Normal rate, regular rhythm, normal heart sounds and normal pulses.   Pulmonary/Chest: Effort normal and breath sounds normal. No respiratory distress.  Chest wall is not dull to percussion. She exhibits no tenderness, no crepitus, no edema, no deformity and no retraction.  Abdominal: Normal appearance.  Musculoskeletal: Normal range of motion.  Neurological: She is alert and oriented to person, place, and time. She has normal strength.  Skin: Skin is warm, dry and intact. She is not diaphoretic.  Psychiatric: She has a normal mood and affect. Her speech is normal and behavior is normal. Cognition and memory are normal.    ED Course  Procedures  11:02 PM-Discussed treatment plan which includes  an antibiotic and 10 percocet.  Labs Review Labs Reviewed - No data to display  Imaging Review No results found.   EKG Interpretation None      MDM   Final diagnoses:  Toothache  Dental decay   Pt refuses dental block.   Patient has dental pain. No emergent s/sx's present. Patent airway. No trismus.  Will be given pain medication and antibiotics. I discussed the need to call dentist within 24/48 hours for follow-up. Dental referral given. Return to ED precautions given.  Pt voiced understanding and has agreed to follow-up.   33 y.o.Betty Curry's evaluation in the Emergency Department is complete. It has been determined that no acute conditions requiring further emergency intervention are present at this time. The patient/guardian have been advised of the diagnosis and plan. We have discussed signs and symptoms that warrant return to the ED, such as changes or worsening in symptoms.  Vital signs are stable at discharge. Filed Vitals:   11/30/13 1845  BP: 94/61  Pulse: 93  Temp: 98.3 F (36.8 C)  Resp: 18    Patient/guardian has voiced understanding and agreed to follow-up with the PCP or specialist.  I personally performed the services described in this documentation, which was scribed in my presence. The recorded information has been reviewed and is accurate.   Dorthula Matasiffany G Bethann Qualley, PA-C 12/01/13 2305

## 2013-11-30 NOTE — ED Notes (Signed)
PA at bedside.

## 2013-12-05 NOTE — ED Provider Notes (Signed)
Medical screening examination/treatment/procedure(s) were performed by non-physician practitioner and as supervising physician I was immediately available for consultation/collaboration.  Julie-Anne Torain T Lyman Balingit, MD 12/05/13 0724 

## 2014-02-13 ENCOUNTER — Emergency Department (HOSPITAL_COMMUNITY)
Admission: EM | Admit: 2014-02-13 | Discharge: 2014-02-13 | Disposition: A | Discharge status: Home / Self Care | Payer: Self-pay | Attending: Emergency Medicine | Admitting: Emergency Medicine

## 2014-02-13 ENCOUNTER — Encounter (HOSPITAL_COMMUNITY): Payer: Self-pay | Admitting: Emergency Medicine

## 2014-02-13 DIAGNOSIS — K089 Disorder of teeth and supporting structures, unspecified: Secondary | ICD-10-CM | POA: Insufficient documentation

## 2014-02-13 DIAGNOSIS — K0889 Other specified disorders of teeth and supporting structures: Secondary | ICD-10-CM

## 2014-02-13 DIAGNOSIS — F17200 Nicotine dependence, unspecified, uncomplicated: Secondary | ICD-10-CM | POA: Insufficient documentation

## 2014-02-13 MED ORDER — TRAMADOL HCL 50 MG PO TABS
50.0000 mg | ORAL_TABLET | Freq: Once | ORAL | Status: AC
  Administered 2014-02-13: 50 mg via ORAL
  Filled 2014-02-13: qty 1

## 2014-02-13 MED ORDER — TRAMADOL HCL 50 MG PO TABS: 50.0000 mg | ORAL_TABLET | Freq: Four times a day (QID) | ORAL | Status: AC | PRN

## 2014-02-13 MED ORDER — PENICILLIN V POTASSIUM 500 MG PO TABS: 500.0000 mg | ORAL_TABLET | Freq: Four times a day (QID) | ORAL | Status: AC

## 2014-02-13 NOTE — ED Notes (Signed)
Pt head butted to chin and busted inside bottom lip; c/o draining pus and pain left jaw from dental pain

## 2014-02-13 NOTE — ED Provider Notes (Signed)
Medical screening examination/treatment/procedure(s) were performed by non-physician practitioner and as supervising physician I was immediately available for consultation/collaboration.   Sayana Salley David Kleigh Hoelzer III, MD 02/13/14 1623 
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
## 2014-02-13 NOTE — Discharge Instructions (Signed)

## 2014-02-14 ENCOUNTER — Ambulatory Visit: Payer: Self-pay

## 2014-03-12 ENCOUNTER — Emergency Department: Payer: Self-pay | Admitting: Internal Medicine

## 2014-03-12 LAB — PROTIME-INR
INR: 1
Prothrombin Time: 12.9 secs (ref 11.5–14.7)

## 2014-03-12 LAB — URINALYSIS, COMPLETE
Bilirubin,UR: NEGATIVE
Glucose,UR: NEGATIVE mg/dL (ref 0–75)
Hyaline Cast: 174
NITRITE: POSITIVE
Ph: 5 (ref 4.5–8.0)
Protein: 30
RBC,UR: 13 /HPF (ref 0–5)
SPECIFIC GRAVITY: 1.014 (ref 1.003–1.030)
Squamous Epithelial: 2

## 2014-03-12 LAB — DRUG SCREEN, URINE
Amphetamines, Ur Screen: POSITIVE (ref ?–1000)
BARBITURATES, UR SCREEN: NEGATIVE (ref ?–200)
Benzodiazepine, Ur Scrn: POSITIVE (ref ?–200)
CANNABINOID 50 NG, UR ~~LOC~~: POSITIVE (ref ?–50)
Cocaine Metabolite,Ur ~~LOC~~: POSITIVE (ref ?–300)
MDMA (Ecstasy)Ur Screen: NEGATIVE (ref ?–500)
Methadone, Ur Screen: NEGATIVE (ref ?–300)
Opiate, Ur Screen: POSITIVE (ref ?–300)
Phencyclidine (PCP) Ur S: NEGATIVE (ref ?–25)
TRICYCLIC, UR SCREEN: NEGATIVE (ref ?–1000)

## 2014-03-12 LAB — COMPREHENSIVE METABOLIC PANEL
ANION GAP: 12 (ref 7–16)
AST: 37 U/L (ref 15–37)
Albumin: 3.5 g/dL (ref 3.4–5.0)
Alkaline Phosphatase: 60 U/L
BILIRUBIN TOTAL: 0.6 mg/dL (ref 0.2–1.0)
BUN: 9 mg/dL (ref 7–18)
CHLORIDE: 106 mmol/L (ref 98–107)
Calcium, Total: 7.9 mg/dL — ABNORMAL LOW (ref 8.5–10.1)
Co2: 19 mmol/L — ABNORMAL LOW (ref 21–32)
Creatinine: 0.91 mg/dL (ref 0.60–1.30)
EGFR (Non-African Amer.): >60
Glucose: 94 mg/dL (ref 65–99)
OSMOLALITY: 272 (ref 275–301)
POTASSIUM: 4 mmol/L (ref 3.5–5.1)
SGPT (ALT): 23 U/L
SODIUM: 137 mmol/L (ref 136–145)
Total Protein: 7.1 g/dL (ref 6.4–8.2)

## 2014-03-12 LAB — CBC
HCT: 28.7 % — ABNORMAL LOW (ref 35.0–47.0)
HGB: 9.3 g/dL — ABNORMAL LOW (ref 12.0–16.0)
MCH: 26.6 pg (ref 26.0–34.0)
MCHC: 32.5 g/dL (ref 32.0–36.0)
MCV: 82 fL (ref 80–100)
Platelet: 392 10*3/uL (ref 150–440)
RBC: 3.51 10*6/uL — AB (ref 3.80–5.20)
RDW: 17.9 % — ABNORMAL HIGH (ref 11.5–14.5)
WBC: 11.3 10*3/uL — ABNORMAL HIGH (ref 3.6–11.0)

## 2014-03-12 LAB — CK TOTAL AND CKMB (NOT AT ARMC)
CK, Total: 167 U/L
CK-MB: 2.1 ng/mL (ref 0.5–3.6)

## 2014-03-26 ENCOUNTER — Emergency Department: Payer: Self-pay | Admitting: Emergency Medicine

## 2014-11-10 ENCOUNTER — Emergency Department: Admit: 2014-11-10 | Discharge status: Against Medical Advice | Payer: Self-pay | Admitting: Emergency Medicine
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
## 2015-02-26 ENCOUNTER — Emergency Department
Admission: EM | Admit: 2015-02-26 | Discharge: 2015-03-22 | Disposition: E | Payer: Self-pay | Attending: Emergency Medicine | Admitting: Emergency Medicine

## 2015-02-26 DIAGNOSIS — Z720 Tobacco use: Secondary | ICD-10-CM | POA: Insufficient documentation

## 2015-02-26 DIAGNOSIS — I469 Cardiac arrest, cause unspecified: Secondary | ICD-10-CM | POA: Insufficient documentation

## 2015-02-26 MED ORDER — NALOXONE HCL 1 MG/ML IJ SOLN
INTRAMUSCULAR | Status: AC | PRN
  Administered 2015-02-26: 2 mg via INTRAMUSCULAR

## 2015-02-26 MED ORDER — DEXTROSE 50 % IV SOLN
INTRAVENOUS | Status: AC | PRN
  Administered 2015-02-26: 1 via INTRAVENOUS

## 2015-02-26 MED ORDER — EPINEPHRINE HCL 0.1 MG/ML IJ SOSY
PREFILLED_SYRINGE | INTRAMUSCULAR | Status: AC | PRN
  Administered 2015-02-26: 0.1 mg via INTRAVENOUS

## 2015-02-27 MED FILL — Medication: Qty: 1 | Status: AC

## 2015-03-22 NOTE — ED Notes (Signed)
House supervisor filled out Funeral release paper, family visited pt in room, chaplin and RN stayed at bedside

## 2015-03-22 NOTE — ED Notes (Signed)
Next of kin: Parents Betty Curry and Betty Curry [phone]

## 2015-03-22 NOTE — Progress Notes (Signed)
   03/03/15 1120  Clinical Encounter Type  Visited With Family  Visit Type Initial;Spiritual support;Death  Referral From Nurse  Consult/Referral To Chaplain  Spiritual Encounters  Spiritual Needs Emotional;Grief support  Stress Factors  Family Stress Factors Loss  Chaplain was paged tot he unit to offer a compassionate presence and support to the friends and family of patient. Patient died and family was devastated. Chaplain Rita Prom A. Christiona Siddique Ext. [phone]

## 2015-03-22 NOTE — ED Notes (Signed)
Car pulled up to EMS bay, family was screaming, stretcher brought to EMS bay, PT removed from back of car, CPR inititaed, Dr. Fanny Bien at bedside, pt unresponsive with no pulse and no spontaneous respirations, pt brought to room 24, code continued  Family reports they picked up pt from [employer] [facility] last night and put her into bed, reports they found pt unresponsive this AM, family reports pt has hx of ETOH and drug abuse, family aware of death. Dr. Fanny Bien and Maurice Small RN spoke to family

## 2015-03-22 NOTE — ED Notes (Signed)
Pt placed in body bag, orderly notified, police notified of pt being moved to morgue so they can conduct further investigation

## 2015-03-22 NOTE — ED Provider Notes (Signed)
[facility] Emergency Department Provider Note  ____________________________________________  Time seen: Approximately 8:30 AM - I was present on arrival in the ambulance bay  I have reviewed the triage vital signs and the nursing notes.  EM caveat: Patient in active cardiac arrest, unresponsive  HISTORY  Chief Complaint Unresponsive   HPI Betty Curry is a 34 y.o. female presents unresponsive.Patient removed from the back of a vehicle in the ambulance bay noted to be cold, cyanotic, apneic, and pulseless. CPR and basic life support immediately initiated.  Patient is noted to be asystolic.  Per friend/family the patient was last seen normal but suspected to be heavily intoxicated and possibly on drugs and approximate 4:30 AM. They sent her to bed, and when they checked on her this morning they noticed that she was unresponsive and put her in the car and drove her here.   No past medical history on file.  There are no active problems to display for this patient.   No past surgical history on file.   Allergies Toradol  No family history on file.  Social History History  Substance Use Topics  . Smoking status: Current Every Day Smoker    Types: Cigarettes  . Smokeless tobacco: Not on file  . Alcohol Use: No    Review of Systems Unable to obtain  ____________________________________________   PHYSICAL EXAM:  VITAL SIGNS: ED Triage Vitals  Enc Vitals Group     BP --      Pulse --      Resp --      Temp --      Temp src --      SpO2 --      Weight --      Height --      Head Cir --      Peak Flow --      Pain Score --      Pain Loc --      Pain Edu? --      Excl. in GC? --    Exam: CPR in progress. Normocephalic/atraumatic. Pupils fixed and dilated bilateral without corneal reflex. Patient apneic, able to be ventilated with BVM with good compliance and end-tidal waveform of approximately 15. CPR continued at 32 2, then 100  compressions per minute after dance to replacement Patient does not have any obvious chest trauma noted, lungs are clear to auscultation bilaterally with good rise and fall Abdomen is soft, old midline surgical scar noted Extremities no obvious trauma noted  ____________________________________________   LABS (all labs ordered are listed, but only abnormal results are displayed)  Labs Reviewed - No data to display ____________________________________________  EKG   ____________________________________________  RADIOLOGY   ____________________________________________   PROCEDURES  Procedure(s) performed: Intraosseous access, intubation, CPR, see procedure note(s).  Critical Care performed: Yes, see critical care note(s)  INTUBATION Performed by: Sharyn Creamer  Required items: required blood products, implants, devices, and special equipment available Patient identity confirmed: provided demographic data and hospital-assigned identification number Time out: Immediately prior to procedure a "time out" was called to verify the correct patient, procedure, equipment, support staff and site/side marked as required.  Indications: apnea  Intubation method: Miller 3, direct  Preoxygenation: BVM  Tube Size: 7.5 cuffed  Post-procedure assessment: chest rise and ETCO2 monitor, Etco2 good waveform with range 15-18. Breath sounds: equal and absent over the epigastrium Tube secured with: ETT holder No immediate complications. Confirmed airway via breath sounds, etco2, direct visual through cords  Cardiopulmonary Resuscitation (CPR) Procedure Note Directed/Performed by: Sharyn Creamer I personally directed ancillary staff and/or performed CPR in an effort to regain return of spontaneous circulation and to maintain cardiac, neuro and systemic perfusion.    Procedure INTRAOSSEUOUS:  Verify the correct patient, procedure, equipment, support staff  Intraosseous line placement,  performed by me. Red EZ IO needle placed into the right tibial tuberosity, good return of marrow and flushes easily. No evidence of complications. Performed by me, Dr. Fanny Bien.  CRITICAL CARE Performed by: Sharyn Creamer   Total critical care time: 25  Critical care time was exclusive of separately billable procedures and treating other patients.  Critical care was necessary to treat or prevent imminent or life-threatening deterioration.    Critical care was time spent personally by me on the following activities: development of treatment plan with patient and/or surrogate as well as nursing, discussions with consultants, evaluation of patient's response to treatment, examination of patient, obtaining history from patient or surrogate, ordering and performing treatments and interventions, ordering and review of laboratory studies, ordering and review of radiographic studies, pulse oximetry and re-evaluation of patient's condition.   ____________________________________________   INITIAL IMPRESSION / ASSESSMENT AND PLAN / ED COURSE  Pertinent labs & imaging results that were available during my care of the patient were reviewed by me and considered in my medical decision making (see chart for details).  Patient presents in cardiac arrest. Last known well time approximately 4:30 AM. Patient asystolic on arrival and throughout her ED course. Never had return of circulation. Reviewed reversible causes, treated for possible hypoglycemia though glucose 70. Also given Narcan. In addition epinephrine, several rounds of CPR, advanced airways was placed and confirmed. In the setting of a prolonged down time, asystolic presenting rhythm the patient's presenting examination determination was made to discontinue resuscitation efforts at 9:02 AM.  At 9:02 AM the patient has fixed and dilated pupils, there is no spontaneous respirations, rhythm strip demonstrates asystole, no pulses are felt, the patient is  cool in a warm environment. Patient pronounced dead at this time by me.  Family who've brought the patient updated, [place] police present, and medical examiner called.  Discussed with Dr. Thurmond Butts (Medical Examiner).  ____________________________________________   FINAL CLINICAL IMPRESSION(S) / ED DIAGNOSES  Final diagnoses:  Cardiac asystole  Cardiac arrest      Sharyn Creamer, MD 03/05/2015 [phone]

## 2015-03-22 NOTE — Code Documentation (Signed)
Patient time of death occurred at 0902 

## 2015-03-22 NOTE — ED Notes (Signed)
[facility] called back, gave ME examiner name Dr. Thurmond Butts and next of kin information

## 2015-03-22 NOTE — Code Documentation (Signed)
CBG-[id number] 

## 2015-03-22 NOTE — ED Notes (Signed)
[facility] notified, referal number [phone], spoke to Cammy Brochure, told to prep eyes before morgue, will call back with next of kin and ME information

## 2015-03-22 DEATH — deceased

## 2015-03-23 IMAGING — CT CT STONE STUDY
1 of 4 series · 2 of 46 positions shown, 5 images · non-contrast
Comparison: CT of the abdomen and pelvis from 08/25/2013

CLINICAL DATA: Left flank pain and hematuria.

EXAM:
CT ABDOMEN AND PELVIS WITHOUT CONTRAST
TECHNIQUE: Multidetector CT imaging of the abdomen and pelvis was performed
following the standard protocol without IV contrast.

[Series 4: lung · axial · 0.68mm/px · z∈[-61,-46]mm · 2 of 10 slices shown, 5 images]
[im 4/10  soft-tissue]
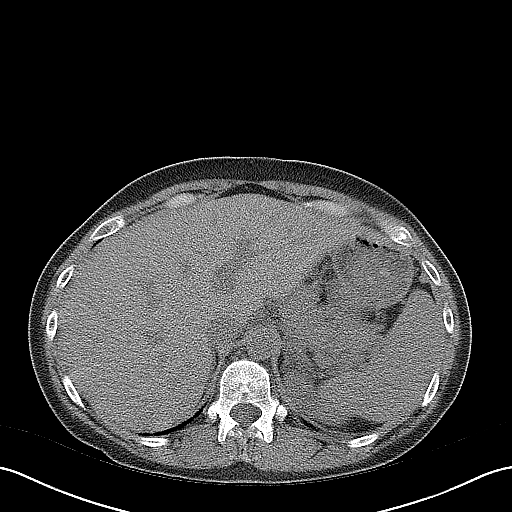
[im 4/10  lung]
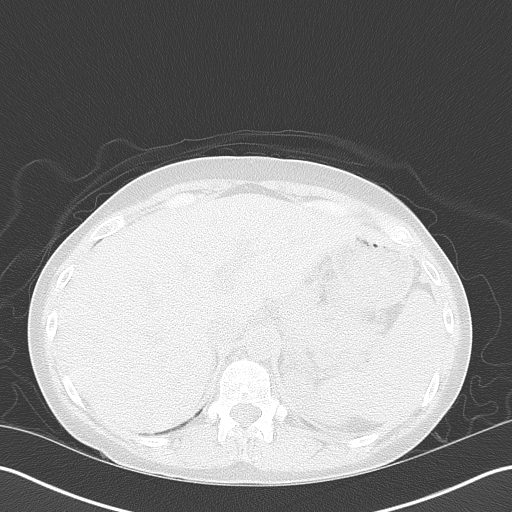
[im 4/10  bone]
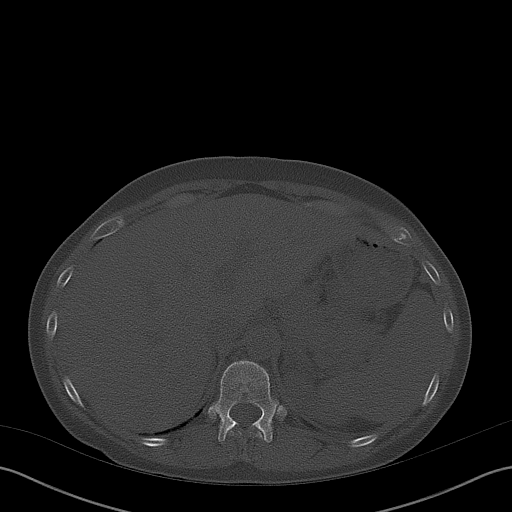
[im 7/10  soft-tissue]
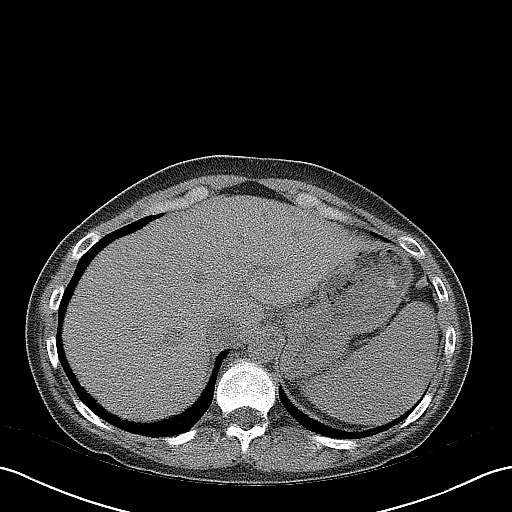
[im 7/10  lung]
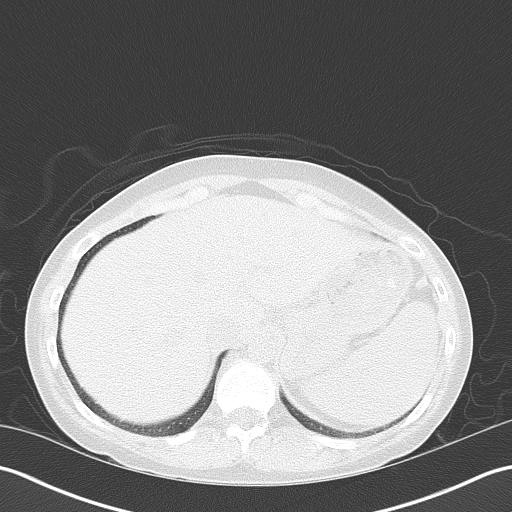

[2 of 46 positions shown; findings below may reference images not displayed]

FINDINGS: The minimally visualized lung bases are clear.

The visualized portions of the liver and spleen are unremarkable in
appearance. The gallbladder is within normal limits. The pancreas
and adrenal glands are unremarkable.

The kidneys are unremarkable in appearance. There is no evidence of
hydronephrosis. No renal or ureteral stones are seen. No perinephric
stranding is appreciated.

No free fluid is identified. The small bowel is unremarkable in
appearance. The stomach is within normal limits. No acute vascular
abnormalities are seen.

The appendix is normal in caliber and contains air, without evidence
for appendicitis. The colon is unremarkable in appearance.

The bladder is moderately distended and grossly unremarkable. The
uterus is within normal limits. The ovaries are relatively
symmetric; no suspicious adnexal masses are seen. No inguinal
lymphadenopathy is seen.

No acute osseous abnormalities are identified.
IMPRESSION: No acute abnormality seen within the abdomen and pelvis.
